# Patient Record
Sex: Female | Born: 1960 | Race: Black or African American | Hispanic: No | Marital: Single | Smoking: Never smoker
Health system: Southern US, Community
[De-identification: ages and names within clinical notes are randomized; demographics above are authoritative.]

## PROBLEM LIST (undated history)

## (undated) DIAGNOSIS — K219 Gastro-esophageal reflux disease without esophagitis: Secondary | ICD-10-CM

## (undated) HISTORY — PX: CHOLECYSTECTOMY: SHX55

## (undated) HISTORY — PX: ABDOMINAL HYSTERECTOMY: SHX81

## (undated) HISTORY — DX: Gastro-esophageal reflux disease without esophagitis: K21.9

---

## 2001-08-29 ENCOUNTER — Other Ambulatory Visit: Admission: RE | Admit: 2001-08-29 | Discharge: 2001-08-29 | Payer: Self-pay | Admitting: Obstetrics and Gynecology

## 2002-10-22 ENCOUNTER — Other Ambulatory Visit: Admission: RE | Admit: 2002-10-22 | Discharge: 2002-10-22 | Payer: Self-pay | Admitting: Obstetrics and Gynecology

## 2002-10-29 ENCOUNTER — Ambulatory Visit (HOSPITAL_COMMUNITY): Admission: RE | Admit: 2002-10-29 | Discharge: 2002-10-29 | Payer: Self-pay | Admitting: Obstetrics and Gynecology

## 2002-10-29 ENCOUNTER — Encounter: Payer: Self-pay | Admitting: Obstetrics and Gynecology

## 2003-11-02 ENCOUNTER — Other Ambulatory Visit: Admission: RE | Admit: 2003-11-02 | Discharge: 2003-11-02 | Payer: Self-pay | Admitting: Obstetrics and Gynecology

## 2003-11-02 ENCOUNTER — Ambulatory Visit (HOSPITAL_COMMUNITY): Admission: RE | Admit: 2003-11-02 | Discharge: 2003-11-02 | Payer: Self-pay | Admitting: Obstetrics and Gynecology

## 2004-03-30 ENCOUNTER — Inpatient Hospital Stay (HOSPITAL_COMMUNITY): Admission: EM | Admit: 2004-03-30 | Discharge: 2004-04-09 | Payer: Self-pay | Admitting: Emergency Medicine

## 2004-03-30 ENCOUNTER — Encounter: Admission: RE | Admit: 2004-03-30 | Discharge: 2004-03-30 | Payer: Self-pay | Admitting: Family Medicine

## 2004-04-01 ENCOUNTER — Encounter (INDEPENDENT_AMBULATORY_CARE_PROVIDER_SITE_OTHER): Payer: Self-pay | Admitting: *Deleted

## 2004-05-01 ENCOUNTER — Ambulatory Visit (HOSPITAL_COMMUNITY): Admission: RE | Admit: 2004-05-01 | Discharge: 2004-05-01 | Payer: Self-pay | Admitting: Surgery

## 2004-09-29 ENCOUNTER — Emergency Department (HOSPITAL_COMMUNITY): Admission: EM | Admit: 2004-09-29 | Discharge: 2004-09-29 | Payer: Self-pay | Admitting: Emergency Medicine

## 2004-11-02 ENCOUNTER — Other Ambulatory Visit: Admission: RE | Admit: 2004-11-02 | Discharge: 2004-11-02 | Payer: Self-pay | Admitting: Obstetrics and Gynecology

## 2004-11-17 ENCOUNTER — Ambulatory Visit (HOSPITAL_COMMUNITY): Admission: RE | Admit: 2004-11-17 | Discharge: 2004-11-17 | Payer: Self-pay | Admitting: Obstetrics and Gynecology

## 2005-11-04 IMAGING — RF DG CHOLANGIOGRAM ADD FILMS
7 series · 7 of 7 positions shown · non-contrast
Comparison: none

CLINICAL DATA: Status post cholecystectomy with partial common bile duct resection and re-anastomosis.  
 T-TUBE CHOLANGIOGRAM
 Spot film of the right upper quadrant was obtained as a scout view prior to contrast injection.  It shows the presence of a T-tube along with two surgical drains.  There is contrast in the colon from prior CT and endoscopy.
 Water-soluble contrast was injected gently into the T-tube.  Most of the contrast preferentially goes into the duodenum, where the distal limb of the T-tube is situated.  However, some contrast does extend into the intrahepatic biliary tree.  On early images, with the patient supine, there are a few tiny filling defects that may represent small stones, inspissated bowel, or air bubbles.  Later, the patient was placed semi-erect, and these filling defects are no longer seen.  I suspect that they were tiny air bubbles.  By placing the patient upright, a small amount of contrast does extend around the T-tube.  I do not see any definite extravasation, but only a very small amount of contrast could be gravitated around the tube at this time.  
 It might be prudent to repeat this study after further healing, prior to removal of the T-tube. 
 IMPRESSION
 1.  The study is limited because most of the contrast preferentially went into the duodenum through the lower limb of the T-tube.
 2.  No gross extravasation.
 3.  No definite retained stones.

[Series 1: run · 1 of 1 slices shown (1 of 7)]
[im 1/1]
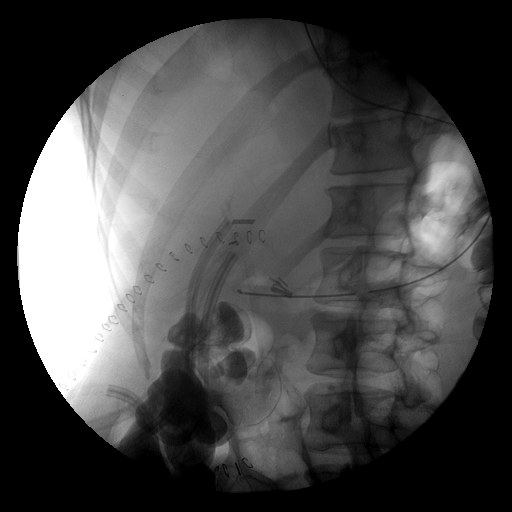

[Series 2: run · 1 of 1 slices shown (2 of 7)]
[im 1/1]
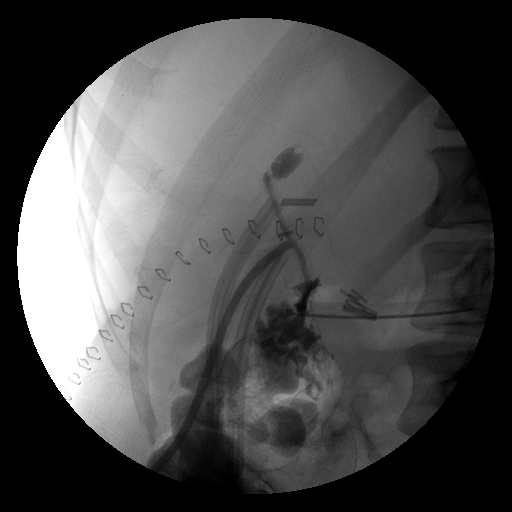

[Series 3: run · 1 of 1 slices shown (3 of 7)]
[im 1/1]
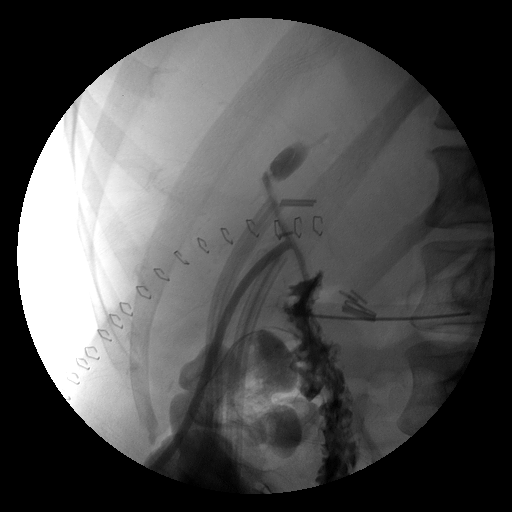

[Series 4: run · 1 of 1 slices shown (4 of 7)]
[im 1/1]
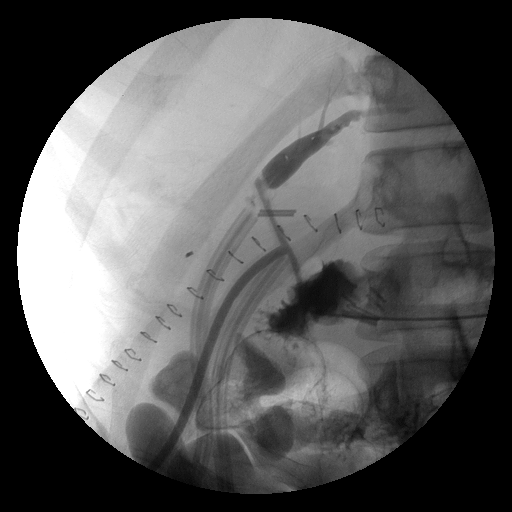

[Series 6: run · 1 of 1 slices shown (5 of 7)]
[im 1/1]
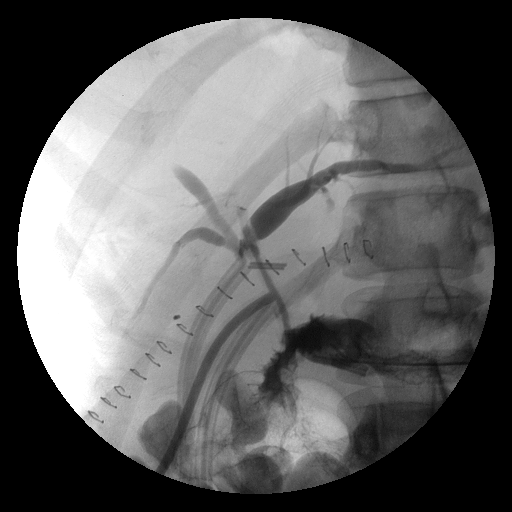

[Series 7: run · 1 of 1 slices shown (6 of 7)]
[im 1/1]
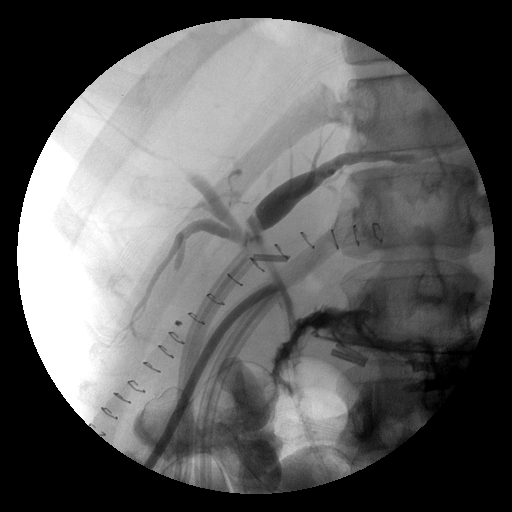

[Series 8: run · 1 of 1 slices shown (7 of 7)]
[im 1/1]
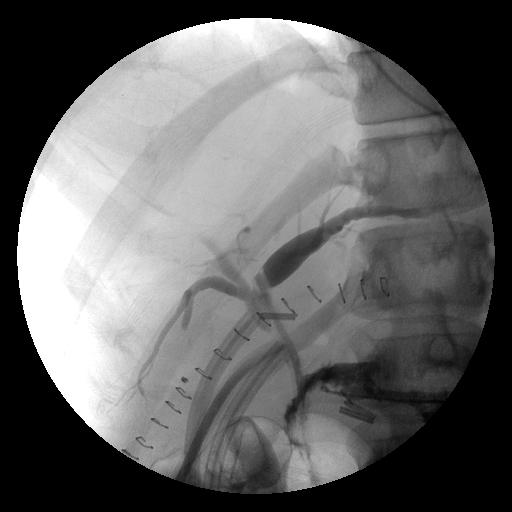

[7 of 7 positions shown; findings below may reference images not displayed]

## 2005-11-06 ENCOUNTER — Other Ambulatory Visit: Admission: RE | Admit: 2005-11-06 | Discharge: 2005-11-06 | Payer: Self-pay | Admitting: Obstetrics and Gynecology

## 2005-11-26 ENCOUNTER — Ambulatory Visit (HOSPITAL_COMMUNITY): Admission: RE | Admit: 2005-11-26 | Discharge: 2005-11-26 | Payer: Self-pay | Admitting: Obstetrics and Gynecology

## 2006-01-02 ENCOUNTER — Inpatient Hospital Stay (HOSPITAL_COMMUNITY): Admission: RE | Admit: 2006-01-02 | Discharge: 2006-01-04 | Payer: Self-pay | Admitting: Obstetrics and Gynecology

## 2006-01-02 ENCOUNTER — Encounter (INDEPENDENT_AMBULATORY_CARE_PROVIDER_SITE_OTHER): Payer: Self-pay | Admitting: Specialist

## 2009-01-04 ENCOUNTER — Ambulatory Visit: Payer: Self-pay | Admitting: Family Medicine

## 2009-02-19 ENCOUNTER — Emergency Department (HOSPITAL_COMMUNITY): Admission: EM | Admit: 2009-02-19 | Discharge: 2009-02-19 | Payer: Self-pay | Admitting: Emergency Medicine

## 2011-05-04 NOTE — H&P (Signed)
NAMEJAZLIN, [name]                         ACCOUNT NO.:  [id number]   MEDICAL RECORD NO.:  [id number]                   PATIENT TYPE:  EMS   LOCATION:  ED                                   FACILITY:  [facility]   PHYSICIAN:  Angelia Mould. Derrell Lolling, M.D.             DATE OF BIRTH:  Jan 07, 1961   DATE OF ADMISSION:  03/30/2004  DATE OF DISCHARGE:                                HISTORY & PHYSICAL   CHIEF COMPLAINT:  Epigastric pain, anorexia, dark urine.   HISTORY OF PRESENT ILLNESS:  This is a 50 year old black female who really  has not had any prior gastrointestinal problems.  Two weeks ago after eating  a large salad, she developed epigastric pain and nausea but she has not  vomited, not had diarrhea. She denies back pain or fever or chills. She has  been constipated. She states the epigastric pain has continued on a daily  basis but is variable in intensity. She endured it for a week.  Two days  ago, she stated the pain got a little bit worse and her urine became dark  and she noticed some itching. She called Dr. Susann Givens at that point and was  given an appointment for this Friday. Today the pain was worse and she came  in to see Dr. Jola Babinski  PA.  The patient was sent for a CT scan at [facility] which showed significantly dilated intrahepatic bile ducts, a few  gallstones, no obvious inflammation of the gallbladder, significant fibroid  uterus.  More worrisome is that she may have a mass at the head of the  pancreas.  This is not clear because of the technique of the skin.   Dr. Jola Babinski PA called me and asked me to see the patient and she was  brought to the [facility] Emergency Room. I am admitting her for further  evaluation and management.   PAST MEDICAL HISTORY:  Two pregnancies and two deliveries.  Occasional  bronchitis.  Last menstrual period was regular and on scheduled last month.  She denies any history of sickle cell disease, diabetes, hypertension, heart  disease,  stroke or sexually transmitted disease.   CURRENT MEDICATIONS:  She takes a diet pill call Trims-PA, she occasionally  takes BC powders.   ALLERGIES:  None known.   FAMILY HISTORY:  Mother living age 73 is prediabetic. Father is deceased but  she does not know anything of his medical history.  One brother healthy, one  sister healthy, one sister with sickle trait.   SOCIAL HISTORY:  The patient lives in [place], she is married, they have  two children. She works as a Diplomatic Services operational officer for [employer], a [employer] Denies  the use of tobacco.  Alcohol intake includes some beer and whiskey on the  weekends. She denies the use of any street drugs.   REVIEW OF SYMPTOMS:  All systems are reviewed. They are noncontributory  except  as described above.   PHYSICAL EXAMINATION:  GENERAL:  Thin, middle-aged, black female who appears  overall healthy but is uncomfortable and moving around in bed a lot. She  seems grumpy and out of sorts. She is cooperative and does give a consistent  history when questioned repeatedly.  VITAL SIGNS:  Temperature 98.9, pulse 82, respirations 72, blood pressure  131/90.  HEENT:  Eyes, sclera slightly icteric. Extraocular movements intact.  Ears,  nose, mouth, throat, nose, lips, tongue and oropharynx without gross  lesions.  NECK:  Supple, nontender, no thyromegaly, no adenopathy, no jugular venous  distention, no tenderness.  LUNGS:  Clear to auscultation, no chest wall tenderness, no CVA tenderness.  HEART:  Regular rate and rhythm, no murmur.  BREASTS:  Not examined.  ABDOMEN:  Soft and nondistended. She is subjectively tender in the right  upper quadrant but objectively there is no mass, no guarding, no rebound.  Bowel sounds are hypoactive, she is not distended. There is no palpable  mass.  GENITOURINARY:  No inguinal adenopathy or mass.  EXTREMITIES:  She moves all four extremities well without pain or deformity.  She has good peripheral pulses.  NEUROLOGIC:   No gross __________ deficits.   ADMISSION DATA:  CT scan suggests mass at the head of the pancreas although  technique is not good.  There are a few gallstones. She has significant  intrahepatic bile duct dilation.  She has a significant fibroid uterus.   Lab work reveals hemoglobin 12.7, white blood cell count 9800 but with a  left shift.  Complete metabolic panel reveals a total bilirubin of 4.8,  alkaline phosphatase of 696, SGPT of 488 otherwise her complete metabolic  panel is normal. Serum amylase is 118.  Urinalysis is pending.   ASSESSMENT:  1. Obstructive jaundice. It is not clear to me whether this is due to stone     disease or whether she may have a mass in the head of her pancreas. This     would be a little bit unusual at her age but will need to be further     evaluated.  2. Acute cholecystitis is not completely ruled out at this time but felt to     be less likely.  3. No evidence of pancreatitis chemically at this time but it is not     completely ruled out.   PLAN:  1. The patient will be admitted for further evaluation and management.  2. She will be started on IV Zosyn and kept n.p.o.  3. We are going to do a dedicated CT with thin cuts of the pancreas by     pancreatic protocol to see if there is a pancreatic mass.   She will repeat her lab work tomorrow morning to see what the trend is.   Dr. Carman Ching is going to see her to decide about indications and timing  of biliary endoscopy.   I told the patient that she may well need an operation at some point but not  until further evaluation.                                               Angelia Mould. Derrell Lolling, M.D.    HMI/MEDQ  D:  03/30/2004  T:  03/30/2004  Job:  [id number]   cc:   Sharlot Gowda, M.D.  [address]  [address]  [place], [place] [address]  Fax: [phone]   Llana Aliment. Malon Kindle., M.D.  [address], [address]  [place]  [place] [address]  Fax: [phone]

## 2011-05-04 NOTE — H&P (Signed)
[name], [name] NO.:  [id number]   MEDICAL RECORD NO.:  [id number]          PATIENT TYPE:  INP   LOCATION:  NA                            FACILITY:  [facility]   PHYSICIAN:  Janine Limbo, M.D.DATE OF BIRTH:  1961/11/14   DATE OF ADMISSION:  01/02/2006  DATE OF DISCHARGE:                                HISTORY & PHYSICAL   HISTORY OF PRESENT ILLNESS:  Ms. [name] is a 50 year old female para 2-0-0-2  who presents for a total abdominal hysterectomy.  The patient has been  followed at the [facility] and Gynecology division of  [facility].  The patient has known large fibroids and an  ultrasound showed a very large uterus with multiple fibroids (largest  fibroid measures 11 cm in size).  The ovaries appeared normal.  The patient  had an endometrial biopsy that was benign.  The patient's most recent Pap  smear is within normal limits.  The patient complains of increasing  abdominal pain and increasing dyspareunia.  The patient had a tubal ligation  in 1994.  Her periods are slightly irregular.  The patient has also had a  cholecystectomy in the past.   OBSTETRICAL HISTORY:  The patient had a normal spontaneous vaginal delivery  in 1992 and again in 1994.   ALLERGIES:  No known drug allergies.   SOCIAL HISTORY:  The patient denies cigarette use, alcohol use, and  recreational drug use.   REVIEW OF SYSTEMS:  The patient does have occasional constipation.   FAMILY HISTORY:  The patient has a sister with sickle cell trait.  She has a  maternal aunt with insulin-dependent diabetes.  Her maternal grandfather had  lung cancer.   PHYSICAL EXAMINATION:  VITAL SIGNS:  Weight 152 pounds, height 5 feet 8  inches.  HEENT:  Within normal limits.  CHEST:  Clear.  HEART:  Regular rate and rhythm.  BREASTS:  Without masses.  ABDOMEN:  Soft.  There is a large mass palpable to the umbilicus.  EXTREMITIES:  Grossly normal.  NEUROLOGIC:   Grossly normal.  PELVIC:  External genitalia is normal.  Vagina is normal.  Cervix is  nontender.  The uterus is 18-20 weeks size, irregular, and firm.  Adnexa:  No masses are appreciated.  Rectovaginal examination confirms.   ASSESSMENT:  1.  18-20-week sized fibroids.  2.  Dyspareunia.  3.  Irregular menstrual cycles.   PLAN:  The patient will undergo a total abdominal hysterectomy.  She  understands the indications for her procedure and she accepts the risks of,  but not limited to, anesthetic complications, bleeding, infections, and  possible damage to the surrounding organs.      Janine Limbo, M.D.  Electronically Signed     AVS/MEDQ  D:  01/01/2006  T:  01/01/2006  Job:  [id number]

## 2011-05-04 NOTE — Op Note (Signed)
[name], [name]                         ACCOUNT NO.:  [id number]   MEDICAL RECORD NO.:  [id number]                   PATIENT TYPE:  INP   LOCATION:  0466                                 FACILITY:  [facility]   PHYSICIAN:  Graylin Shiver, M.D.                DATE OF BIRTH:  12/29/1960   DATE OF PROCEDURE:  03/31/2004  DATE OF DISCHARGE:                                 OPERATIVE REPORT   PROCEDURE PERFORMED:  Endoscopic retrograde cholangiogram with  sphincterotomy.   INDICATIONS FOR PROCEDURE:  Abdominal pain, obstructive jaundice, abnormal  ultrasound and CT scan showing gallstones and probable common bile duct  stones.   Informed consent was obtained after explanation of the risks of bleeding,  infection, perforation, and pancreatitis.   PREMEDICATION:  Fentanyl 100 mcg IV, Versed 9 mg IV.   PROCEDURE:  With the patient lying on her abdomen on the fluoroscopy table  in x-ray, the lateral viewing duodenoscope was inserted into the oropharynx  and passed into the esophagus.  It was advanced down the esophagus, into the  stomach, and into the duodenum.  No specific abnormalities were noted in the  stomach or in the duodenum.  The papilla of Vater was localized.  It looked  normal, although I could see a lot of pus draining out of the papilla when  this was first seen.  The papillotome and guidewire were used to cannulate.  The pancreatic duct was initially cannulated a couple of times, then with  repositioning, I was able to selectively cannulate the common bile duct.  A  guidewire was advanced proximally, and contrast was injected into the common  bile duct and intrahepatic ducts.  The common bile duct and intrahepatic  ducts looked dilated.  There were no definite stones seen in the bile ducts.  There was a shadowing over the region of the upper common hepatic duct area,  but we felt this was due to bowel gas.  There were some filling defects  noted in the proximal ducts, but  we felt these were due to air.  Pus kept  draining out of the common bile duct.  The sphincterotome was properly  positioned, and a sphincterotomy was performed.  The duct was then swept  with a balloon.  I initially placed a 12 mm balloon but could not advance  this through the papilla.  I therefore switched to an 8.5 mm balloon and was  able to sweep the duct and was able to bring the 8.5 mm balloon out of the  papilla.  No obvious stones were seen, but pus was draining.  I did not see  the gallbladder or the cystic duct.  Dr. Colonel Bald from radiology was  present during a portion of the procedure.  There was quite a bit of air  present in the abdomen, and we could not get a clear picture as to whether  there  might be some extravasation or free air; therefore, immediately after  the ERCP, we took the patient to the CT scanner and CT'd her abdomen.  There  was no evidence of extravasation or free air.  We were able to see the  gallbladder on the CT scan, which did have some contrast in it and showed  stones.  She tolerated the procedure well.   IMPRESSION:  Dilated bile ducts with pus draining out of the biliary tree.  No obvious stones were seen.  A sphincterotomy was done, and the duct was  swept and decompressed.  There are stones present in the gallbladder.   PLAN:  1. Continue antibiotics.  2. Proceed with laparoscopic cholecystectomy.                                               Graylin Shiver, M.D.    Germain Osgood  D:  03/31/2004  T:  03/31/2004  Job:  [id number]   cc:   Angelia Mould. Derrell Lolling, M.D.  [address], [address]  [place]  [place] [address]  Fax: [phone]   Llana Aliment. Malon Kindle., M.D.  [address]  [place]  [place] [address]  Fax: [phone]

## 2011-05-04 NOTE — Discharge Summary (Signed)
[name], GREENWOOD NO.:  [id number]   MEDICAL RECORD NO.:  [id number]          PATIENT TYPE:  INP   LOCATION:  9317                          FACILITY:  [facility]   PHYSICIAN:  Janine Limbo, M.D.DATE OF BIRTH:  August 09, 1961   DATE OF ADMISSION:  01/02/2006  DATE OF DISCHARGE:                                 DISCHARGE SUMMARY   ADMISSION DIAGNOSES:  1.  Eighteen- to 20-week-size fibroid uterus.  2.  Dyspareunia.  3.  Irregular menstrual cycles.   POSTOPERATIVE DIAGNOSES:  1.  Eighteen- to 20-week-size fibroid uterus.  2.  Dyspareunia.  3.  Irregular menstrual cycles.  4.  Anemia.   PROCEDURES THIS ADMISSION:  January 02, 2006 - total abdominal hysterectomy.   HISTORY OF PRESENT ILLNESS:  [name] is a 50 year old female para 2-0-0-2  who presents with the above-mentioned diagnoses. She has not responded to  conservative measures. Please see her dictated history and physical exam for  details.   ADMISSION PHYSICAL EXAMINATION:  The patient was noted to have an 18- to 20-  week-size fibroid uterus.   HOSPITAL COURSE:  On the day of admission the patient was taken to the  operating room where she had a total abdominal hysterectomy performed.  Operative findings included an 18- to 20-week-size fibroid uterus that  weighed approximately 1700 g. The ovaries appeared normal bilaterally. 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 The patient tolerated  her procedure well. Her postoperative course was uneventful. Her  postoperative hemoglobin was 10.1 (preoperative hemoglobin was 12.5). The  patient remained afebrile throughout her hospital stay. The patient  tolerated a regular diet. She was ready for discharge on postoperative day  #2.   DISCHARGE MEDICATIONS:  1.  Vicodin one or two tablets every 4 hours as needed for pain.  2.  Ibuprofen 600 mg every 6 hours as needed for pain.  3.  Iron 325 mg twice each day for 6 weeks.   FOLLOW-UP  INSTRUCTIONS:  The patient will return to see Dr. Stefano Gaul in 6  weeks for follow-up examination. She will refrain from driving for 2 weeks,  heavy lifting for 4 weeks, and intercourse for 6 weeks. She was given a  copy of the postoperative instruction sheet as prepared by [facility] and Gynecology (a division of [facility]). She  will call with questions or concerns.   Final pathology report pending at the time of discharge.      Janine Limbo, M.D.  Electronically Signed     AVS/MEDQ  D:  01/04/2006  T:  01/04/2006  Job:  [id number]

## 2011-05-04 NOTE — Op Note (Signed)
[name], [name] NO.:  [id number]   MEDICAL RECORD NO.:  [id number]          PATIENT TYPE:  INP   LOCATION:  9317                          FACILITY:  [facility]   PHYSICIAN:  Janine Limbo, M.D.DATE OF BIRTH:  19-Apr-1961   DATE OF PROCEDURE:  01/02/2006  DATE OF DISCHARGE:                                 OPERATIVE REPORT   PREOPERATIVE DIAGNOSIS:  1.  Fibroid uterus.  2.  Dyspareunia.  3.  Irregular menstrual cycles.   POSTOPERATIVE DIAGNOSIS:  1.  Fibroid uterus.  2.  Dyspareunia.  3.  Irregular menstrual cycles.  4.  Right hydatid cyst.   PROCEDURE:  1.  Total abdominal hysterectomy.  2.  Removal of right hydatid cyst.   SURGEON:  Dr. Leonard Schwartz   FIRST ASSISTANT:  Naima A. Normand Sloop, M.D.   ANESTHETIC:  General.   DISPOSITION:  Ms. Keiper is a 50 year old female, para 2-0-0-2, who presents  with the above-mentioned diagnoses. She understands the indications for her  procedure and she accepts the risks of, but not limited to, anesthetic  complications, bleeding, infections, and possible damage to surrounding  organs.   FINDINGS:  The patient had an 18-week size multi fibroid uterus. There was a  large pedunculated fibroid on the fundus of the uterus that measured  approximately 16 cm x 12 cm. The fallopian tubes and ovaries appeared normal  except for defects in the tubes from the patient's prior tubal ligation.  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 There was no  evidence of pathology in the pelvis. The uterus weighed approximately 1700  grams.   PROCEDURE:  The patient was taken to the operating room where a general  anesthetic was given. The patient's abdomen, perineum, and vagina were  prepped with multiple layers of Betadine. Examination under anesthesia was  performed. A Foley catheter was placed in the bladder. The patient was  sterilely draped. The lower abdomen was injected with 20 mL of half percent  Marcaine with epinephrine. A low transverse incision was made and carried  sharply through the subcutaneous tissue, fascia, and the anterior  peritoneum. The bowel was packed cephalad. The ureters were identified  bilaterally and were thought to be away from our surgical areas. The round  ligaments were identified bilaterally. The left round ligament was clamped,  cut, sutured, and held to the side. The left utero-ovarian ligament and left  fallopian tube were then clamped, cut, free tied, and suture ligated. The  bladder flap was developed anteriorly. The uterine vessels were  skeletonized. Identical procedure was then carried out on the opposite side.  Then alternating from left-to-right the uterine arteries, parametrial  tissues, paracervical tissues, uterosacral ligaments, and vaginal angles  were then clamped, cut, sutured, and tied securely. The uterus was then  transected from the apex of the vagina. The cervix was noted to be  completely removed. The apex of the vagina was then closed using figure-of-  eight sutures. The pelvis was irrigated. An area of bleeding was made  hemostatic using a free tie of 0 Vicryl.  The ureters were again identified  and were thought to be without harm. The pelvis was irrigated for one final  time and again hemostasis was noted to be adequate. The patient was noted to  drain clear yellow urine. The hydatid cyst on the right tube was then  clamped and cut. A free tie was used to secure hemostasis. All instruments  were then removed. The anterior peritoneum was closed using running suture  of 3-0 Vicryl. The subcutaneous tissue, fascia, and abdominal musculature  were irrigated. Hemostasis was adequate. The fascia was then closed using a  running suture of 0 Vicryl followed by three interrupted sutures of 0  Vicryl. The subcutaneous layer was closed using a running suture of 0  Vicryl. The skin was reapproximated using a subcuticular suture of 3-0   Monocryl. Sponge, needle, instrument counts were correct on two occasions.  The estimated blood loss was 300 mL.  The patient tolerated her procedure  well. She was awakened from her anesthetic without difficulty and taken to  the recovery room in stable condition.      Janine Limbo, M.D.  Electronically Signed     AVS/MEDQ  D:  01/02/2006  T:  01/02/2006  Job:  [id number]

## 2011-05-04 NOTE — Consult Note (Signed)
[name], [name]                         ACCOUNT NO.:  [id number]   MEDICAL RECORD NO.:  [id number]                   PATIENT TYPE:  INP   LOCATION:  0102                                 FACILITY:  [facility]   PHYSICIAN:  James L. Malon Kindle., M.D.          DATE OF BIRTH:  1961/10/18   DATE OF CONSULTATION:  03/30/2004  DATE OF DISCHARGE:                                   CONSULTATION   REFERRING PHYSICIAN:  Dr. Claud Kelp.   HISTORY:  A 50 year old African-American female, who states that she was  well up until approximately 2 weeks ago when she ate a bad salad.  She has  had epigastric pain for 2 weeks.  She states that up until this time, she  had been pain free.  She had no heartburn, no indigestion, good bowel  habits.  She had not lost weight and in fact, she had just started back on  the pill called Trims PA which was some sort of over-the-counter diet pill.  She also has taken [facility] Powders for pain and cramps during her period.  Her  last period was March 5 and right around that time, she took [facility] Powders on a  regular basis.  She has never had ulcers.  She has continued to have pain  and developed dark urine was to be seen in Dr. Jola Babinski office yesterday  and came into his office today demanding to be seen and had a CT scan done  at [facility].  I do not have that scan available to me, but the report  is that it showed a thickened pancreas and dilated biliary ducts.  Apparently, this has been reviewed, and there is a real concern about a mass  in the head of the pancreas, and a scan has been ordered specific to the  pancreas.  The skin also showed cholelithiasis.  She was sent to the  emergency room to be admitted by Dr. Derrell Lolling.  Called on to questions about  her biliary system.   Pertinent labs reveal an alk phos of 696, total bilirubin of 4.8, GPT 488,  GOT 419, albumin 3.8.  White count was normal at 9.8, hemoglobin 12.7.  The  patient denies any previous history  of ulcers and denies feeling bad prior  to eating the bad salad 2 weeks ago.   CURRENT MEDICATIONS:  1. BC Powders p.r.n.  2. Trims PA.   ALLERGIES:  She has no drug allergies.   MEDICAL HISTORY:  No chronic medical problems.   FAMILY HISTORY:  Negative for gallstones.   REVIEW OF SYSTEMS:  Primarily as noted above.   PHYSICAL EXAMINATION:  VITAL SIGNS:  Temperature 98.9, pulse 82, blood  pressure 131/90.  GENERAL:  A somewhat thin, anxious, African-American female, who is lying on  the bed and stating that she is cold and her abdomen is hurting.  EYE:  Sclerae are icteric.  MOUTH:  Mucous  membranes are moist.  NECK:  Supple.  LUNGS:  Clear.  HEART:  Regular rate and rhythm.  No murmurs or gallops.  ABDOMEN:  Very mild tenderness in the right upper quadrant.  No guarding,  rigidity, or rebound.   ASSESSMENT:  Jaundice, cholelithiasis, and dilated biliary system on CT with  a mass in the head of the pancreas could be due to many things.  Her amylase  is only 118.  She is a nondrinker.  I suspect this is all gallstones.  She  is a bit young for a pancreatic malignancy, but this does need to be  evaluated further.   PLAN:  We will go ahead with a repeat CT to look specifically at the biliary  system and pancreas and agree with empiric antibiotics and pain control.  If  there is still a question, then I think she will need an ERCP with a  possible stent or sphincterotomy should a stone be present.  I have  discussed this with she and her mother.                                               James L. Malon Kindle., M.D.    Waldron Session  D:  03/30/2004  T:  03/30/2004  Job:  [id number]   cc:   Angelia Mould. Derrell Lolling, M.D.  [address], [address]  [place]  [place] [address]  Fax: [phone]   Sharlot Gowda, M.D.  [address]  [place], [place] [address]  Fax: [phone]

## 2011-05-04 NOTE — Op Note (Signed)
NAMEEVADNE, OSE                         ACCOUNT NO.:  [id number]   MEDICAL RECORD NO.:  [id number]                   PATIENT TYPE:  INP   LOCATION:  0466                                 FACILITY:  [facility]   PHYSICIAN:  Currie Paris, M.D.           DATE OF BIRTH:  19-Jul-1961   DATE OF PROCEDURE:  04/01/2004  DATE OF DISCHARGE:                                 OPERATIVE REPORT   PREOPERATIVE DIAGNOSES:  1. Chronic calculus cholecystitis.  2. Possible choledocholithiasis.   POSTOPERATIVE DIAGNOSES:  Severe chronic calculus cholecystitis with  choledocholithiasis.   OPERATION:  Laparoscopy, open cholecystectomy with common duct exploration  and choledochoduodenostomy.   SURGEON:  Dr. Jamey Ripa.   ASSISTANT:  Dr. Magnus Ivan.   ANESTHESIA:  General endotracheal.   CLINICAL HISTORY:  This patient is a 50 year old, who presented with some  obstructive jaundice.  An ERCP has shown pus draining out of the  gallbladder, but __________ stones could not be seen, and the anatomy was  really not well-defined on the films.  Because of her ongoing right upper  quadrant pain, known stones in the gallbladder, she was scheduled for  cholecystectomy with plans for laparoscopic cholecystectomy if possible.   DESCRIPTION OF PROCEDURE:  The patient was in the holding area and had no  further questions.  She was taken to the operating room and after  satisfactory general endotracheal anesthesia had been obtained, the abdomen  was prepped and draped.  Marcaine 0.25% was used for the initial incisions  and the umbilical incision made, the fascia opened, and the peritoneal  cavity entered under direct vision.  A pursestring was placed, the Hasson  introduced, and the abdomen insufflated to 15.  The patient was placed in  reverse Trendelenburg and trocars placed in the epigastrium and right upper  quadrant in the usual fashion.  The gallbladder was thick-walled and very  difficult to grasp and  looked like it was contracted down around some  stones.  The duodenum was pulled way up on the gallbladder and stuck and  very adherent.  We began our dissection by trying to grasp what appeared to  be known gallbladder and using a combination of dissection and  hydrodissection, I tried to pull the duodenum off of the gallbladder.  There  were some fibrofatty tissue that we noticed and hard stones and what  appeared to be the more proximal fundus of the gallbladder but again, this  anatomy was not clear.  We spent about 20 minutes simply trying to dissect  and find a dissection plane.  I opened up some of the peritoneum on what was  obviously the gallbladder to see if I could find a plane there, and none was  found.  Further dissection was done inferiorly, and we saw a tubular  structure which from the tugging, had a little bit of a bile leak noted.  I  continued to dissect it out for  a few minutes and decided this was most  likely the common duct but elected to put a small catheter in and do a  cholangiogram to see if that would help to define this anatomy initially  before we proceeded any further.   The [facility] catheter was introduced and placed in this and held with a small  clip.  Operative cholangiography did show good filling of the duodenum, and  this was clearly the common duct.   At this point, the laparoscopic instruments were removed and the abdomen  opened through a right subcostal incision.  A skin incision made with a  knife and the subcutaneous tissue, fascia, and muscles divided with the  cautery.  Self-retaining retractors were placed.   The prior irrigant that we had been using for irrigation and dissection was  suctioned out.  The gallbladder was thick-walled, chronically inflamed, and  stuck down to the area of the common duct and portal triad.   I had to grasp the dome of the gallbladder with Kocher and amputate the  gallbladder from above to below using cautery.   There was skin and no plane  of dissection between the gallbladder and the liver, but I was able to get  down to the gallbladder both anteriorly and posteriorly until I got into the  area to where I could not really identify anatomy.  Palpation of this area  showed there were stones in the gallbladder and stones stuck up towards the  liver.  Since I already an opening in the common duct just distal to the  gallbladder, I put a right-angle clamp up in this and could feel stones up  in this area.  There appeared to be no plane of dissection between the  gallbladder and the common duct so at this point, I opened the common duct  longitudinally, starting where it was already open just distal to what I  thought was the cystic duct going proximally into the common hepatic duct.  There were multiple stones in the common duct going all the way up in the  common hepatic duct, and these were gently pulled out.  I then opened the  gallbladder from its dome into the common duct so that I could clearly have  this area all opened up and identified.   It was clear at this point that the gallbladder had basically eroded itself  into the common duct, and there was a common channel of gallbladder common  duct that was approximately 3 cm long with stones in the gallbladder, stones  in the common hepatic duct.   Using the scissors, I cut the gallbladder off of the common duct.  I then  irrigated and tried to assess the situation.  We had a very longitudinal  choledochotomy.  However, all this tissue was chronically inflamed and  ragged, and I thought we were going to need to do a choledochoenteric  anastomosis.   At this point, to that end, I dissected off the common duct distal to where  the gallbladder had entered and once I had that clearly surrounded, I went  ahead and put 3 clips on it and completely divided the common duct in two. With this as a handle, I was then able to dissect proximally on the  common  duct going to where I was above where the gallbladder had entered and where  the common channel and all the inflamed tissue was and had the common duct  dissected out circumferentially at  that point.   I put a couple of stay sutures in the common duct here, and it was about 15  mm across at a minimum.  I divided the common duct here so that we had it  opened.  We explored the proximal hepatic ducts, and there were no  additional stones present.   We considered doing both a choledochoduodenostomy and a  choledochojejunostomy.  The duodenum was quite mobile and with a little bit  of freeing up, was basically right up on the common duct, and we had a  fairly long section of common hepatic duct to work with.  I therefore  elected to do a choledochoduodenostomy.   I used 4-0 Prolenes and put a back row of seromuscular sutures into the  duodenum and then into the back wall of the common duct.  Once this row was  placed, they were all tied and the middle ones cut, leaving the two end ones  open.  The duodenum was opened with the cautery.   An inner row of 4-0 PDS was then placed through-and-through duodenum, common  duct.  Once I had the back row placed, I took an 18 T2.  I made a small  opening into the common duct well above where the anastomosis was and  brought the T2 about with one limb going into the hepatic ducts and the  other limb going down into the duodenum.  Once that was placed, I went ahead  and completed the inner row of 4-0 PDS running to complete the front row of  the anastomosis with the inner layer and then put an outer layer of  seromuscular 4-0 Prolenes which all tied easily and with no tension.  The  anastomosis appeared completely intact, and everything looked viable.   At this point, I irrigated the T-tube and saw a little leakage of fluid.  Initially, it appeared to be leaking around the anastomosis but then further  inspection showed that actually as we  irrigated it, there was a leak out of  the duct of Luschka about a third of the way up the posterior wall of the  gallbladder fossa.  This was suture ligated with a 2-0 chromic and again,  irrigation then appeared to show no evidence of leak.   We spent some time irrigating the abdomen.  I brought the T-tube out through  a lateral stab wound, secured it with some 2-0 nylons.  I put two Blake 19  drains in, one anterior and one posterior to the common duct area to catch  any leakage that might occur.  The liver bed appeared to be completely dry.   The abdomen was then closed with a running 0 PDS in the posterior sheath,  running #1 PDS on the anterior sheath, staples on the skin.   The patient tolerated the procedure well.  There were no operative  complications.  All counts were correct.                                               Currie Paris, M.D.   CJS/MEDQ  D:  04/01/2004  T:  04/01/2004  Job:  [id number]   cc:   Sharlot Gowda, M.D.  [address]  [place], [place] [address]  Fax: [phone]   Graylin Shiver, M.D.  [address]  [address]  [place], [place] [address]  Fax: [phone]

## 2011-05-04 NOTE — Discharge Summary (Signed)
NAMESEVERA, [name]                         ACCOUNT NO.:  [id number]   MEDICAL RECORD NO.:  [id number]                   PATIENT TYPE:  INP   LOCATION:  0466                                 FACILITY:  [facility]   PHYSICIAN:  Currie Paris, M.D.           DATE OF BIRTH:  [birth year]   DATE OF ADMISSION:  03/30/2004  DATE OF DISCHARGE:  04/09/2004                                 DISCHARGE SUMMARY   FINAL DIAGNOSES:  1. Chronic calculous cholecystitis.  2. Choledocholithiasis.  3. Cholecystocholedochal fistula.  4. Malnutrition.   CLINICAL HISTORY:  [name] is a 50 year old lady who was apparently well  until two weeks ago when she developed chronic epigastric pain with some  weight loss and had a CT done which showed a question of a mass in the head  of the pancreas, also noted to be jaundiced and also noted to have  cholelithiasis.  On admission exam, she had mild tenderness to the right  upper quadrant.  Her amylase was 118.   HOSPITAL COURSE:  The patient was admitted, evaluation begun and GI  consultation obtained. She had an ERCP and they saw no specific problem in  the area of the head of the pancreas to suggest tumor. She did appear to  have gallstones and I thought they had cleared her common duct.  A CT scan  was somewhat confusing initially as there was a question of stones in the  gallbladder and stones in the common duct although the ERCP was not clear on  this issue. At any rate, she was scheduled for cholecystectomy with plans  for an attempt at a laparoscopic cholecystectomy.   The patient was taken to the operating room on April 16 and at the time of  surgery was found to have significant choledocholithiasis and basically the  stones had eroded from her gallbladder into her common duct leaving a fairly  long junction between the gallbladder and common duct. She had to be  converted to open with cholecystectomy and choledochoduodenostomy with CT   drainage.   The patient actually tolerated the procedure well. She drained bowel out of  her T tube and her liver functions began to gradually improve. Her amylase  remained normal. We left her on NG drainage for several days because of the  duodenocholedochal anastomosis.  She never really drained any bowel out of  her JP drains only the T tube.  By about the fourth postoperative day, she  was improving a little bit. She did note some blood in her urine but was  having her menstrual.  There was no evidence of urinary tract infection. We  did a T tube cholangiogram which showed no evidence of leak and good  drainage and good decompression of the hepatic ducts. We began her on  liquids and increased her diet. We discontinued her antibiotics.  Over the  next couple of days, she was able to  tolerate her T tube being clamped. She  tolerated conversion to oral nicely.   The patient was ready for discharge and was sent home on the 24th with her T  tube to drainage and one JP left in and one pulled out prior to discharge.   LABORATORY DATA:  Laboratory studies this admission initially showed a white  count of 9800 with a hemoglobin of 12.7.  This returned to normal  postoperatively although her hemoglobin drifted down to 9.3.  Electrolytes showed a couple of low sodiums but this was very minimal and  not symptomatic. At one point, she developed some hypokalemia repaired with  changing of her T&A's.  Liver functions initially were 4.8 going up to 6.1  gradually down to 2.4 a few days prior to discharge.  Cultures from her  urine were no growth.  EKG was normal.                                               Currie Paris, M.D.    CJS/MEDQ  D:  04/26/2004  T:  04/26/2004  Job:  [id number]   cc:   Fayrene Fearing L. Malon Kindle., M.D.  [address]  [place]  [place] [address]  Fax: [phone]   Sharlot Gowda, M.D.  [address]  [place], [place] [address]  Fax: [phone]

## 2013-01-24 ENCOUNTER — Ambulatory Visit: Payer: Managed Care, Other (non HMO) | Admitting: Family Medicine

## 2013-01-24 VITALS — BP 111/70 | HR 70 | Temp 98.4°F | Resp 16 | Ht 69.0 in | Wt 203.0 lb

## 2013-01-24 DIAGNOSIS — A599 Trichomoniasis, unspecified: Secondary | ICD-10-CM

## 2013-01-24 DIAGNOSIS — R300 Dysuria: Secondary | ICD-10-CM

## 2013-01-24 DIAGNOSIS — N898 Other specified noninflammatory disorders of vagina: Secondary | ICD-10-CM

## 2013-01-24 LAB — POCT URINALYSIS DIPSTICK
Bilirubin, UA: NEGATIVE
Glucose, UA: NEGATIVE
Ketones, UA: NEGATIVE
Nitrite, UA: NEGATIVE
Protein, UA: 30
Spec Grav, UA: >=1.03
Urobilinogen, UA: 0.2
pH, UA: 5.5

## 2013-01-24 LAB — POCT UA - MICROSCOPIC ONLY
Bacteria, U Microscopic: NEGATIVE
Casts, Ur, LPF, POC: NEGATIVE
Crystals, Ur, HPF, POC: NEGATIVE
Mucus, UA: NEGATIVE
Yeast, UA: NEGATIVE

## 2013-01-24 LAB — POCT WET PREP WITH KOH: Trichomonas, UA: POSITIVE

## 2013-01-24 MED ORDER — PHENAZOPYRIDINE HCL 200 MG PO TABS: 200.0000 mg | ORAL_TABLET | Freq: Three times a day (TID) | ORAL | Status: AC | PRN

## 2013-01-24 MED ORDER — METRONIDAZOLE 500 MG PO TABS: 2000.0000 mg | ORAL_TABLET | Freq: Three times a day (TID) | ORAL | Status: DC

## 2013-01-24 NOTE — Patient Instructions (Addendum)
Trichomoniasis Trichomoniasis is an infection, caused by the Trichomonas organism, that affects both women and men. In women, the outer female genitalia and the vagina are affected. In men, the penis is mainly affected, but the prostate and other reproductive organs can also be involved. Trichomoniasis is a sexually transmitted disease (STD) and is most often passed to another person through sexual contact. The majority of people who get trichomoniasis do so from a sexual encounter and are also at risk for other STDs. CAUSES   Sexual intercourse with an infected partner.  It can be present in swimming pools or hot tubs. SYMPTOMS   Abnormal gray-green frothy vaginal discharge in women.  Vaginal itching and irritation in women.  Itching and irritation of the area outside the vagina in women.  Penile discharge with or without pain in males.  Inflammation of the urethra (urethritis), causing painful urination.  Bleeding after sexual intercourse. RELATED COMPLICATIONS  Pelvic inflammatory disease.  Infection of the uterus (endometritis).  Infertility.  Tubal (ectopic) pregnancy.  It can be associated with other STDs, including gonorrhea and chlamydia, hepatitis B, and HIV. COMPLICATIONS DURING PREGNANCY  Early (premature) delivery.  Premature rupture of the membranes (PROM).  Low birth weight. DIAGNOSIS   Visualization of Trichomonas under the microscope from the vagina discharge.  Ph of the vagina greater than 4.5, tested with a test tape.  Trich Rapid Test.  Culture of the organism, but this is not usually needed.  It may be found on a Pap test.  Having a "strawberry cervix,"which means the cervix looks very red like a strawberry. TREATMENT   You may be given medication to fight the infection. Inform your caregiver if you could be or are pregnant. Some medications used to treat the infection should not be taken during pregnancy.  Over-the-counter medications or  creams to decrease itching or irritation may be recommended.  Your sexual partner will need to be treated if infected. HOME CARE INSTRUCTIONS   Take all medication prescribed by your caregiver.  Take over-the-counter medication for itching or irritation as directed by your caregiver.  Do not have sexual intercourse while you have the infection.  Do not douche or wear tampons.  Discuss your infection with your partner, as your partner may have acquired the infection from you. Or, your partner may have been the person who transmitted the infection to you.  Have your sex partner examined and treated if necessary.  Practice safe, informed, and protected sex.  See your caregiver for other STD testing. SEEK MEDICAL CARE IF:   You still have symptoms after you finish the medication.  You have an oral temperature above 102 F (38.9 C).  You develop belly (abdominal) pain.  You have pain when you urinate.  You have bleeding after sexual intercourse.  You develop a rash.  The medication makes you sick or makes you throw up (vomit). Document Released: 05/29/2001 Document Revised: 02/25/2012 Document Reviewed: 06/24/2009 [employer] Patient Information 2013 [employer].  

## 2013-01-24 NOTE — Progress Notes (Signed)
Subjective:    Patient ID: [name], female    DOB: [birth year], 52 y.o.   MRN: [id number] Chief Complaint  Patient presents with  . Urinary Tract Infection    Dysuria x 3 weeks  . Vaginal Itching    x several months  . Rash    HPI   Has been going on for about 3 wk - intermittently, tried drinking a lot of cranberry juice and pushing fluids, having urinary hesitancy and dysuria.  No flank pain, no abd/pelvic, no n/v.  No f/c. Is having some vaginal discharge and itching - yellow-green. Would like to be tested for stds. Not really sexually active - has had sex twice in the past yr - both were 1 night stands when pt was out of town and she does not know how to get a hold of these men. Has had a hysterectomy (benign reasons) but cervix was left in place per pt and still has both ovaries. Past Medical History  Diagnosis Date  . GERD (gastroesophageal reflux disease)    No current outpatient prescriptions on file prior to visit.   No current facility-administered medications on file prior to visit.   No Known Allergies    Review of Systems  Constitutional: Negative for fever, chills, diaphoresis, activity change, appetite change, fatigue and unexpected weight change.  Gastrointestinal: Negative for nausea, vomiting, abdominal pain, diarrhea, constipation, blood in stool, anal bleeding and rectal pain.  Genitourinary: Positive for dysuria, vaginal discharge, difficulty urinating and vaginal pain. Negative for urgency, frequency, hematuria, flank pain, decreased urine volume, vaginal bleeding, enuresis, genital sores, menstrual problem and pelvic pain.  Musculoskeletal: Negative for gait problem.  Skin: Negative for rash.  Hematological: Negative for adenopathy.  Psychiatric/Behavioral: The patient is not nervous/anxious.       BP 111/70  Pulse 70  Temp(Src) 98.4 F (36.9 C)  Resp 16  Ht 5\' 9"  (1.753 m)  Wt 203 lb (92.08 kg)  BMI 29.96 kg/m2  SpO2 98% Objective:   Physical Exam  Constitutional: She is oriented to person, place, and time. She appears well-developed and well-nourished. No distress.  HENT:  Head: Normocephalic and atraumatic.  Cardiovascular: Normal rate, regular rhythm, normal heart sounds and intact distal pulses.   Pulmonary/Chest: Effort normal and breath sounds normal.  Abdominal: Soft. Bowel sounds are normal. She exhibits no distension. There is no hepatosplenomegaly. There is no tenderness. There is no rebound, no guarding and no CVA tenderness.  Large scar across RUQ from cholecystectomy converted to open  Genitourinary: Uterus normal. Pelvic exam was performed with patient supine. There is no rash, tenderness or lesion on the right labia. There is no rash, tenderness or lesion on the left labia. Cervix exhibits no motion tenderness and no friability. Right adnexum displays no mass, no tenderness and no fullness. Left adnexum displays no mass, no tenderness and no fullness. No erythema or tenderness around the vagina. Vaginal discharge found.  No cervix seen, very tender on bimanual. Copious amount of thin yellow-green odiverous discharge. No uterus - surgically absent  Lymphadenopathy:       Right: No inguinal adenopathy present.       Left: No inguinal adenopathy present.  Neurological: She is alert and oriented to person, place, and time.  Skin: Skin is warm and dry. She is not diaphoretic.  Psychiatric: She has a normal mood and affect. Her behavior is normal.       Results for orders placed in visit on 01/24/13  POCT URINALYSIS DIPSTICK  Result Value Range   Color, UA yellow     Clarity, UA cloudy     Glucose, UA neg     Bilirubin, UA neg     Ketones, UA neg     Spec Grav, UA >=1.030     Blood, UA moderate     pH, UA 5.5     Protein, UA 30     Urobilinogen, UA 0.2     Nitrite, UA neg     Leukocytes, UA large (3+)    POCT UA - MICROSCOPIC ONLY      Result Value Range   WBC, Ur, HPF, POC tntc     RBC, urine,  microscopic 2-4     Bacteria, U Microscopic neg     Mucus, UA neg     Epithelial cells, urine per micros 5-10     Crystals, Ur, HPF, POC neg     Casts, Ur, LPF, POC neg     Yeast, UA neg    POCT WET PREP WITH KOH      Result Value Range   Trichomonas, UA Positive     Clue Cells Wet Prep HPF POC 1-3     Epithelial Wet Prep HPF POC 2-3     Yeast Wet Prep HPF POC negative     Bacteria Wet Prep HPF POC 2+     RBC Wet Prep HPF POC 2-3     WBC Wet Prep HPF POC 30-35     KOH Prep POC Negative      Assessment & Plan:  1. UTI - will not treat - no bacteria in urine so ua sxs likey caused by trich. Clx pendning so if clx is +, pt will need informed and antibiotic sent in. 2. Trich - 2000mg  flagyl po x 1 now - pt is not currently sexually active and her last partner was a 1 night stand out of town - does not know how to get into contact with him but at least she won't re-infect herself  Meds ordered this encounter  Medications  . metroNIDAZOLE (FLAGYL) 500 MG tablet    Sig: Take 4 tablets (2,000 mg total) by mouth 3 (three) times daily.    Dispense:  4 tablet    Refill:  0  . phenazopyridine (PYRIDIUM) 200 MG tablet    Sig: Take 1 tablet (200 mg total) by mouth 3 (three) times daily as needed for pain.    Dispense:  10 tablet    Refill:  0

## 2013-01-25 LAB — URINE CULTURE: Colony Count: 70000

## 2013-01-27 LAB — GC/CHLAMYDIA PROBE AMP
CT Probe RNA: NEGATIVE
GC Probe RNA: NEGATIVE

## 2013-10-19 ENCOUNTER — Encounter (HOSPITAL_BASED_OUTPATIENT_CLINIC_OR_DEPARTMENT_OTHER): Payer: Self-pay | Admitting: Emergency Medicine

## 2013-10-19 ENCOUNTER — Emergency Department (HOSPITAL_BASED_OUTPATIENT_CLINIC_OR_DEPARTMENT_OTHER)
Admission: EM | Admit: 2013-10-19 | Discharge: 2013-10-19 | Discharge status: Against Medical Advice | Payer: Self-pay | Attending: Emergency Medicine | Admitting: Emergency Medicine

## 2013-10-19 DIAGNOSIS — R109 Unspecified abdominal pain: Secondary | ICD-10-CM | POA: Insufficient documentation

## 2013-10-19 NOTE — ED Notes (Signed)
Went to obtain vitals on this pt.  Pt states "where am I?  I told my work that this would pass in a few minutes I don't need to be here. I had my gallbladder out a few years ago and this happens when I eat greasy food. I made the mistake and ate at [employer] this morning and its just not settling." Address given to patient.  Sissy Hoff notified.

## 2013-10-19 NOTE — ED Notes (Signed)
Abdominal pain at work rates 10/10 states she has this about once a month since she had her gallbladder removed year ago

## 2019-09-24 ENCOUNTER — Other Ambulatory Visit: Payer: Self-pay

## 2019-09-24 ENCOUNTER — Emergency Department (HOSPITAL_BASED_OUTPATIENT_CLINIC_OR_DEPARTMENT_OTHER)
Admission: EM | Admit: 2019-09-24 | Discharge: 2019-09-24 | Discharge status: Against Medical Advice | Payer: Managed Care, Other (non HMO)

## 2019-09-24 NOTE — ED Notes (Signed)
Pt advised EMT to wait when called from ED WR-reg clerk then advised that pt is not being seen because she was no longer having pain

## 2020-03-08 ENCOUNTER — Encounter (HOSPITAL_COMMUNITY): Payer: Self-pay | Admitting: Emergency Medicine

## 2020-03-08 ENCOUNTER — Other Ambulatory Visit: Payer: Self-pay

## 2020-03-08 ENCOUNTER — Emergency Department (HOSPITAL_COMMUNITY)
Admission: EM | Admit: 2020-03-08 | Discharge: 2020-03-09 | Disposition: A | Discharge status: Home / Self Care | Payer: 59 | Attending: Emergency Medicine | Admitting: Emergency Medicine

## 2020-03-08 DIAGNOSIS — Y929 Unspecified place or not applicable: Secondary | ICD-10-CM | POA: Diagnosis not present

## 2020-03-08 DIAGNOSIS — X58XXXA Exposure to other specified factors, initial encounter: Secondary | ICD-10-CM | POA: Diagnosis not present

## 2020-03-08 DIAGNOSIS — Y999 Unspecified external cause status: Secondary | ICD-10-CM | POA: Insufficient documentation

## 2020-03-08 DIAGNOSIS — S76802A Unspecified injury of other specified muscles, fascia and tendons at thigh level, left thigh, initial encounter: Secondary | ICD-10-CM | POA: Diagnosis present

## 2020-03-08 DIAGNOSIS — Y9301 Activity, walking, marching and hiking: Secondary | ICD-10-CM | POA: Diagnosis not present

## 2020-03-08 DIAGNOSIS — S76212A Strain of adductor muscle, fascia and tendon of left thigh, initial encounter: Secondary | ICD-10-CM

## 2020-03-08 NOTE — ED Triage Notes (Signed)
Patient reports worsening pain at left groin/left hip joint radiating to left thigh onset last week , denies injury/ambulatory , pain increases when sitting/changing positions . Denies fever or chills.

## 2020-03-09 ENCOUNTER — Emergency Department (HOSPITAL_COMMUNITY): Payer: 59

## 2020-03-09 MED ORDER — KETOROLAC TROMETHAMINE 15 MG/ML IJ SOLN
15.0000 mg | Freq: Once | INTRAMUSCULAR | Status: AC
  Administered 2020-03-09: 15 mg via INTRAMUSCULAR
  Filled 2020-03-09 (×2): qty 1

## 2020-03-09 MED ORDER — CYCLOBENZAPRINE HCL 5 MG PO TABS: 5.0000 mg | ORAL_TABLET | Freq: Two times a day (BID) | ORAL | 0 refills | Status: AC | PRN

## 2020-03-09 MED ORDER — NAPROXEN 500 MG PO TABS: 500.0000 mg | ORAL_TABLET | Freq: Two times a day (BID) | ORAL | 0 refills | Status: AC

## 2020-03-09 MED ORDER — OXYCODONE-ACETAMINOPHEN 5-325 MG PO TABS
1.0000 | ORAL_TABLET | Freq: Once | ORAL | Status: AC
  Administered 2020-03-09: 1 via ORAL
  Filled 2020-03-09: qty 1

## 2020-03-09 NOTE — ED Provider Notes (Signed)
[facility] EMERGENCY DEPARTMENT Provider Note   CSN: [id number] Arrival date & time: 03/08/20  2242     History Chief Complaint  Patient presents with  . Groin Pain    [name] is a 59 y.o. female.  HPI     This is a 59 year old female with no significant past medical history who presents with left groin pain.  Patient reports that approximately 3 weeks ago she began to walk daily in an effort to lose weight.  She has increased her walking time from to 1 to 2 hours.  She states over the last several days she has had increasing pain in the left groin.  Initially it woke her from sleep.  It is worse with internal and external rotation of the hip.  She has been taking ibuprofen with minimal relief.  She is not aware of any trauma.  She denies numbness or tingling in the leg.  She states that the pain became unbearable last night.  She rates her pain at 7 out of 10 while sitting in the bed.  However, she states that if she moves her leg, it becomes unbearable.  Denies any bowel or bladder difficulty.  Denies any urinary symptoms or fevers.  Past Medical History:  Diagnosis Date  . GERD (gastroesophageal reflux disease)     There are no problems to display for this patient.   Past Surgical History:  Procedure Laterality Date  . ABDOMINAL HYSTERECTOMY    . CHOLECYSTECTOMY       OB History   No obstetric history on file.     Family History  Problem Relation Age of Onset  . Hypertension Mother     Social History   Tobacco Use  . Smoking status: Never Smoker  Substance Use Topics  . Alcohol use: Yes  . Drug use: No    Home Medications Prior to Admission medications   Medication Sig Start Date End Date Taking? Authorizing Provider  ibuprofen (ADVIL) 200 MG tablet Take 200 mg by mouth every 6 (six) hours as needed for mild pain.   Yes [provider]  cyclobenzaprine (FLEXERIL) 5 MG tablet Take 1 tablet (5 mg total) by mouth  2 (two) times daily as needed for muscle spasms. 03/09/20   Brayleigh Rybacki, Mayer Masker, MD  metroNIDAZOLE (FLAGYL) 500 MG tablet Take 4 tablets (2,000 mg total) by mouth 3 (three) times daily. Patient not taking: Reported on 03/09/2020 01/24/13   Sherren Mocha, MD  naproxen (NAPROSYN) 500 MG tablet Take 1 tablet (500 mg total) by mouth 2 (two) times daily. 03/09/20   Ty Oshima, Mayer Masker, MD  phenazopyridine (PYRIDIUM) 200 MG tablet Take 1 tablet (200 mg total) by mouth 3 (three) times daily as needed for pain. Patient not taking: Reported on 03/09/2020 01/24/13   Sherren Mocha, MD    Allergies    Patient has no known allergies.  Review of Systems   Review of Systems  Constitutional: Negative for fever.  Respiratory: Negative for shortness of breath.   Cardiovascular: Negative for chest pain.  Gastrointestinal: Negative for abdominal pain, nausea and vomiting.  Musculoskeletal:       Left groin pain  Neurological: Negative for weakness and numbness.  All other systems reviewed and are negative.   Physical Exam Updated Vital Signs BP 134/74 (BP Location: Right Arm)   Pulse 83   Temp 99.4 F (37.4 C) (Oral)   Resp 20   Ht 1.727 m (5\' 8" )  Wt 90 kg   SpO2 100%   BMI 30.17 kg/m   Physical Exam Vitals and nursing note reviewed.  Constitutional:      Appearance: She is well-developed.     Comments: Tearful but nontoxic-appearing  HENT:     Head: Normocephalic and atraumatic.     Mouth/Throat:     Mouth: Mucous membranes are moist.  Eyes:     Pupils: Pupils are equal, round, and reactive to light.  Cardiovascular:     Rate and Rhythm: Normal rate and regular rhythm.     Heart sounds: Normal heart sounds.  Pulmonary:     Effort: Pulmonary effort is normal. No respiratory distress.     Breath sounds: No wheezing.  Abdominal:     General: Bowel sounds are normal.     Palpations: Abdomen is soft.     Tenderness: There is no abdominal tenderness.     Hernia: No hernia is present.    Musculoskeletal:     Cervical back: Neck supple.     Comments: No objective tenderness to palpation of the left groin, no bulging or mass noted, pain with internal and external rotation of the hip, no tenderness to palpation of the greater trochanter, difficult active range of motion secondary to pain, normal passive range of motion but with pain  Skin:    General: Skin is warm and dry.  Neurological:     Mental Status: She is alert and oriented to person, place, and time.  Psychiatric:        Mood and Affect: Mood normal.     ED Results / Procedures / Treatments   Labs (all labs ordered are listed, but only abnormal results are displayed) Labs Reviewed - No data to display  EKG None  Radiology DG Pelvis 1-2 Views  Result Date: 03/09/2020 CLINICAL DATA:  Left groin pain EXAM: PELVIS - 1-2 VIEW COMPARISON:  None. FINDINGS: There is no evidence of pelvic fracture or diastasis. 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 No pelvic bone lesions are seen. IMPRESSION: No acute osseous abnormality. Electronically Signed   By: Prudencio Pair M.D.   On: 03/09/2020 03:46    Procedures Procedures (including critical care time)  Medications Ordered in ED Medications  oxyCODONE-acetaminophen (PERCOCET/ROXICET) 5-325 MG per tablet 1 tablet (1 tablet Oral Given 03/09/20 0325)  ketorolac (TORADOL) 15 MG/ML injection 15 mg (15 mg Intramuscular Given 03/09/20 0447)    ED Course  I have reviewed the triage vital signs and the nursing notes.  Pertinent labs & imaging results that were available during my care of the patient were reviewed by me and considered in my medical decision making (see chart for details).    MDM Rules/Calculators/A&P                       Patient presents with left groin pain.  Onset after initiating an exercise routine although she does not recall any trauma.  She is tearful on exam but nontoxic.  Pain elicited  with internal and external rotation of the left leg.  There is no obvious overlying skin changes, hernia.  Suspect strain.  Other considerations include occult pelvic ring fracture.  X-rays negative.  Patient much improved after Percocet and Toradol.  She is ambulatory independently.  Recommend stretching exercises and naproxen with Flexeril as needed.  She was advised not to take Flexeril while driving.  After history, exam, and medical workup I feel  the patient has been appropriately medically screened and is safe for discharge home. Pertinent diagnoses were discussed with the patient. Patient was given return precautions.   Final Clinical Impression(s) / ED Diagnoses Final diagnoses:  Inguinal strain, left, initial encounter    Rx / DC Orders ED Discharge Orders         Ordered    cyclobenzaprine (FLEXERIL) 5 MG tablet  2 times daily PRN     03/09/20 0523    naproxen (NAPROSYN) 500 MG tablet  2 times daily     03/09/20 0523           Zyrah Wiswell, Mayer Masker, MD 03/09/20 0530

## 2020-03-09 NOTE — Discharge Instructions (Signed)
You were seen today with left groin pain.  This is likely muscle strain or inflammation.  Take naproxen twice daily and add a muscle relaxer.  Do not drive while taking muscle relaxants.  Make sure that you are stretching.  Follow-up with sports medicine if not improving.

## 2020-03-09 NOTE — ED Notes (Signed)
Pt was able to ambulate to the bathroom w/ "less pain," stand by assistance only.

## 2020-07-16 ENCOUNTER — Ambulatory Visit: Payer: 59

## 2023-06-03 ENCOUNTER — Encounter (HOSPITAL_COMMUNITY): Payer: Self-pay

## 2023-06-03 ENCOUNTER — Emergency Department (HOSPITAL_BASED_OUTPATIENT_CLINIC_OR_DEPARTMENT_OTHER): Payer: Managed Care, Other (non HMO)

## 2023-06-03 ENCOUNTER — Emergency Department (HOSPITAL_COMMUNITY)
Admission: EM | Admit: 2023-06-03 | Discharge: 2023-06-03 | Disposition: A | Discharge status: Home / Self Care | Payer: Managed Care, Other (non HMO) | Attending: Emergency Medicine | Admitting: Emergency Medicine

## 2023-06-03 ENCOUNTER — Other Ambulatory Visit: Payer: Self-pay

## 2023-06-03 DIAGNOSIS — M7989 Other specified soft tissue disorders: Secondary | ICD-10-CM | POA: Diagnosis present

## 2023-06-03 DIAGNOSIS — R52 Pain, unspecified: Secondary | ICD-10-CM

## 2023-06-03 DIAGNOSIS — N760 Acute vaginitis: Secondary | ICD-10-CM | POA: Diagnosis not present

## 2023-06-03 DIAGNOSIS — B9689 Other specified bacterial agents as the cause of diseases classified elsewhere: Secondary | ICD-10-CM | POA: Insufficient documentation

## 2023-06-03 LAB — CBC WITH DIFFERENTIAL/PLATELET
Abs Immature Granulocytes: 0.03 10*3/uL (ref 0.00–0.07)
Basophils Absolute: 0 10*3/uL (ref 0.0–0.1)
Basophils Relative: 0 %
Eosinophils Absolute: 0.1 10*3/uL (ref 0.0–0.5)
Eosinophils Relative: 2 %
HCT: 38.5 % (ref 36.0–46.0)
Hemoglobin: 12.4 g/dL (ref 12.0–15.0)
Immature Granulocytes: 0 %
Lymphocytes Relative: 28 %
Lymphs Abs: 2 10*3/uL (ref 0.7–4.0)
MCH: 29.9 pg (ref 26.0–34.0)
MCHC: 32.2 g/dL (ref 30.0–36.0)
MCV: 92.8 fL (ref 80.0–100.0)
Monocytes Absolute: 0.4 10*3/uL (ref 0.1–1.0)
Monocytes Relative: 6 %
Neutro Abs: 4.3 10*3/uL (ref 1.7–7.7)
Neutrophils Relative %: 64 %
Platelets: 220 10*3/uL (ref 150–400)
RBC: 4.15 MIL/uL (ref 3.87–5.11)
RDW: 13.7 % (ref 11.5–15.5)
WBC: 6.9 10*3/uL (ref 4.0–10.5)
nRBC: 0 % (ref 0.0–0.2)

## 2023-06-03 LAB — WET PREP, GENITAL
Sperm: NONE SEEN
Trich, Wet Prep: NONE SEEN
WBC, Wet Prep HPF POC: <10 (ref <10)
Yeast Wet Prep HPF POC: NONE SEEN

## 2023-06-03 LAB — URINALYSIS, W/ REFLEX TO CULTURE (INFECTION SUSPECTED)
Bacteria, UA: NONE SEEN
Bilirubin Urine: NEGATIVE
Glucose, UA: NEGATIVE mg/dL
Ketones, ur: NEGATIVE mg/dL
Leukocytes,Ua: NEGATIVE
Nitrite: NEGATIVE
Protein, ur: NEGATIVE mg/dL
Specific Gravity, Urine: 1.002 — ABNORMAL LOW (ref 1.005–1.030)
pH: 6 (ref 5.0–8.0)

## 2023-06-03 LAB — BASIC METABOLIC PANEL
Anion gap: 9 (ref 5–15)
BUN: 11 mg/dL (ref 6–20)
CO2: 27 mmol/L (ref 22–32)
Calcium: 9.1 mg/dL (ref 8.9–10.3)
Chloride: 103 mmol/L (ref 98–111)
Creatinine, Ser: 0.59 mg/dL (ref 0.44–1.00)
GFR, Estimated: >60 mL/min (ref >60)
Glucose, Bld: 103 mg/dL — ABNORMAL HIGH (ref 70–99)
Potassium: 3.9 mmol/L (ref 3.5–5.1)
Sodium: 139 mmol/L (ref 135–145)

## 2023-06-03 LAB — HIV ANTIBODY (ROUTINE TESTING W REFLEX): HIV Screen 4th Generation wRfx: NONREACTIVE

## 2023-06-03 MED ORDER — METRONIDAZOLE 500 MG PO TABS: 500.0000 mg | ORAL_TABLET | Freq: Once | ORAL | Status: DC

## 2023-06-03 MED ORDER — STERILE WATER FOR INJECTION IJ SOLN
INTRAMUSCULAR | Status: AC
  Administered 2023-06-03: 1.9 mL
  Filled 2023-06-03: qty 10

## 2023-06-03 MED ORDER — METRONIDAZOLE 500 MG PO TABS
500.0000 mg | ORAL_TABLET | Freq: Once | ORAL | Status: AC
  Administered 2023-06-03: 500 mg via ORAL
  Filled 2023-06-03: qty 1

## 2023-06-03 MED ORDER — CEFTRIAXONE SODIUM 1 G IJ SOLR
500.0000 mg | Freq: Once | INTRAMUSCULAR | Status: AC
  Administered 2023-06-03: 500 mg via INTRAMUSCULAR
  Filled 2023-06-03: qty 10

## 2023-06-03 MED ORDER — METRONIDAZOLE 500 MG PO TABS: 500.0000 mg | ORAL_TABLET | Freq: Two times a day (BID) | ORAL | 0 refills | Status: DC

## 2023-06-03 MED ORDER — AZITHROMYCIN 250 MG PO TABS
1000.0000 mg | ORAL_TABLET | Freq: Once | ORAL | Status: AC
  Administered 2023-06-03: 1000 mg via ORAL
  Filled 2023-06-03: qty 4

## 2023-06-03 NOTE — Progress Notes (Signed)
RLE venous duplex has been completed.  Preliminary results given to [facility], PA-C.   Results can be found under chart review under CV PROC. 06/03/2023 7:03 PM Jackelynn Hosie RVT, RDMS

## 2023-06-03 NOTE — ED Provider Notes (Signed)
[place] EMERGENCY DEPARTMENT AT [facility] Provider Note   CSN: [id number] Arrival date & time: 06/03/23  1478     History  Chief Complaint  Patient presents with   Leg Swelling    [name] is a 62 y.o. female who presents to ED complaining of right upper leg swelling since this morning. Feels like it could be due to dehydration or possible groin lymphadenopathy.  Patient also complains of intermittent RLQ pain when she drinks beer/soda/ETOH. No pain currently. Also endorses recent unprotected sex and is wondering if this is related to RLQ pain. Denies vaginal irritation, dyspareunia, dysuria, hematuria. Patient requesting STI testing.  Denies hx DVT/PE, trauma, hx cancer, tobacco use, estrogen use, recent surgeries/immobilization, chest pain, dyspnea, palpitations. Denies numbness/paresthesias.  HPI     Home Medications Prior to Admission medications   Medication Sig Start Date End Date Taking? Authorizing Provider  cyclobenzaprine (FLEXERIL) 5 MG tablet Take 1 tablet (5 mg total) by mouth 2 (two) times daily as needed for muscle spasms. 03/09/20   Horton, Mayer Masker, MD  ibuprofen (ADVIL) 200 MG tablet Take 200 mg by mouth every 6 (six) hours as needed for mild pain.    [provider]  metroNIDAZOLE (FLAGYL) 500 MG tablet Take 4 tablets (2,000 mg total) by mouth 3 (three) times daily. Patient not taking: Reported on 03/09/2020 01/24/13   Sherren Mocha, MD  naproxen (NAPROSYN) 500 MG tablet Take 1 tablet (500 mg total) by mouth 2 (two) times daily. 03/09/20   Horton, Mayer Masker, MD  phenazopyridine (PYRIDIUM) 200 MG tablet Take 1 tablet (200 mg total) by mouth 3 (three) times daily as needed for pain. Patient not taking: Reported on 03/09/2020 01/24/13   Sherren Mocha, MD      Allergies    Patient has no known allergies.    Review of Systems   Review of Systems  Musculoskeletal:        Right upper leg swelling    Physical Exam Updated Vital  Signs BP (!) 149/102   Pulse 72   Temp 98.3 F (36.8 C) (Oral)   Resp 17   Ht 5\' 8"  (1.727 m)   Wt 90 kg   SpO2 100%   BMI 30.17 kg/m  Physical Exam Vitals and nursing note reviewed.  Constitutional:      General: She is not in acute distress.    Appearance: She is not ill-appearing or toxic-appearing.  HENT:     Head: Normocephalic and atraumatic.     Mouth/Throat:     Mouth: Mucous membranes are moist.     Pharynx: No oropharyngeal exudate or posterior oropharyngeal erythema.  Eyes:     General: No scleral icterus.       Right eye: No discharge.        Left eye: No discharge.     Conjunctiva/sclera: Conjunctivae normal.  Cardiovascular:     Rate and Rhythm: Normal rate and regular rhythm.     Pulses: Normal pulses.     Heart sounds: Normal heart sounds. No murmur heard.    Comments: +2 pedal pulse Pulmonary:     Effort: Pulmonary effort is normal. No respiratory distress.     Breath sounds: Normal breath sounds. No wheezing, rhonchi or rales.  Abdominal:     General: Abdomen is flat.     Palpations: Abdomen is soft.     Tenderness: There is no abdominal tenderness.  Musculoskeletal:     Right lower leg: No  edema.     Left lower leg: No edema.     Comments: No swelling appreciated of right leg when compared to left. No calf tenderness.  Skin:    General: Skin is warm and dry.     Findings: No rash.     Comments: No lesions/rashes/erythema  Neurological:     General: No focal deficit present.     Mental Status: She is alert. Mental status is at baseline.     Comments: Sensation to light touch intact of bilateral lower legs.  Psychiatric:        Mood and Affect: Mood normal.     ED Results / Procedures / Treatments   Labs (all labs ordered are listed, but only abnormal results are displayed) Labs Reviewed  WET PREP, GENITAL - Abnormal; Notable for the following components:      Result Value   Clue Cells Wet Prep HPF POC PRESENT (*)    All other components  within normal limits  BASIC METABOLIC PANEL - Abnormal; Notable for the following components:   Glucose, Bld 103 (*)    All other components within normal limits  URINALYSIS, W/ REFLEX TO CULTURE (INFECTION SUSPECTED) - Abnormal; Notable for the following components:   Color, Urine COLORLESS (*)    Specific Gravity, Urine 1.002 (*)    Hgb urine dipstick SMALL (*)    All other components within normal limits  CBC WITH DIFFERENTIAL/PLATELET  HIV ANTIBODY (ROUTINE TESTING W REFLEX)  GC/CHLAMYDIA PROBE AMP ([place]) NOT AT [facility]    EKG None  Radiology VAS [place] LOWER EXTREMITY VENOUS (DVT) (7a-7p)  Result Date: 06/03/2023  Lower Venous DVT Study Patient Name:  [name]  Date of Exam:   06/03/2023 Medical Rec #: [id number]        Accession #:    [id number] Date of Birth: 04/09/1961        Patient Gender: F Patient Age:   38 years Exam Location:  [facility] Procedure:      VAS [place] LOWER EXTREMITY VENOUS (DVT) Referring Phys: [facility] Winnie Umali --------------------------------------------------------------------------------  Indications: Swelling & pain since this morning.  Comparison Study: No previous exams Performing Technologist: Jody Hill RVT, RDMS  Examination Guidelines: A complete evaluation includes B-mode imaging, spectral Doppler, color Doppler, and power Doppler as needed of all accessible portions of each vessel. Bilateral testing is considered an integral part of a complete examination. Limited examinations for reoccurring indications may be performed as noted. The reflux portion of the exam is performed with the patient in reverse Trendelenburg.  +---------+---------------+---------+-----------+----------+--------------+ RIGHT    CompressibilityPhasicitySpontaneityPropertiesThrombus Aging +---------+---------------+---------+-----------+----------+--------------+ CFV      Full           Yes      Yes                                  +---------+---------------+---------+-----------+----------+--------------+ SFJ      Full                                                        +---------+---------------+---------+-----------+----------+--------------+ FV Prox  Full           Yes      Yes                                 +---------+---------------+---------+-----------+----------+--------------+  FV Mid   Full           Yes      Yes                                 +---------+---------------+---------+-----------+----------+--------------+ FV DistalFull           Yes      Yes                                 +---------+---------------+---------+-----------+----------+--------------+ PFV      Full                                                        +---------+---------------+---------+-----------+----------+--------------+ POP      Full           Yes      Yes                                 +---------+---------------+---------+-----------+----------+--------------+ PTV      Full                                                        +---------+---------------+---------+-----------+----------+--------------+ PERO     Full                                                        +---------+---------------+---------+-----------+----------+--------------+   +----+---------------+---------+-----------+----------+--------------+ LEFTCompressibilityPhasicitySpontaneityPropertiesThrombus Aging +----+---------------+---------+-----------+----------+--------------+ CFV Full           Yes      Yes                                 +----+---------------+---------+-----------+----------+--------------+     Summary: RIGHT: - There is no evidence of deep vein thrombosis in the lower extremity.  - No cystic structure found in the popliteal fossa.  LEFT: - No evidence of common femoral vein obstruction.  *See table(s) above for measurements and observations.    Preliminary     Procedures Procedures     Medications Ordered in ED Medications - No data to display  ED Course/ Medical Decision Making/ A&P                             Medical Decision Making Amount and/or Complexity of Data Reviewed Labs: ordered.   This patient presents to the ED for concern of right leg swelling, this involves an extensive number of treatment options, and is a complaint that carries with it a high risk of complications and morbidity.  The differential diagnosis includes cellulitis, abscess, DVT, lymphedema, venous insufficiency   Co morbidities that complicate the patient evaluation  none     Lab Tests:  I Ordered, and personally interpreted labs.  The pertinent results include:   -wet prep: positive for clue cells -gc/chlamydia: pending -UA: not concerning for infection -BMP: no concern for electrolyte abnormality; no concern for kidney damage -HIV: pending -CBC: No concern for anemia or leukocytosis    Imaging Studies ordered:  I ordered imaging studies including  -VAS [place] lower extremity: to assess for DVT in right leg I independently visualized and interpreted imaging I agree with the radiologist interpretation    Problem List / ED Course / Critical interventions / Medication management  Patient presents to the ED complaining of right groin swelling and intermittent mild RLQ pain.  Upon talking with patient she endorses having intermittent vaginal discharge and recent unprotected sex. Ultrasound without concern for DVT.  Blood work reassuring.  Urinalysis without concern for infection.  Wet prep concerning for bacterial vaginosis.  GC/chlamydia swab pending. Provided patient with a dose of Flagyl for BV here in ED and will discharge her with rest of course of Flagyl. Also provided patient with dose of ceftriaxone, and azithromycin here in the ED for GC/Chlamydia prophylaxis.  Educated patient to follow-up with [facility] if her GC/chlamydia swabs  come back positive.  Patient afebrile and denies severe abdominal pain, dyspareunia - less concerning for PID/ovarian abscess at this time. [place] without concern for DVT. I believe her subjective leg swelling is due to lymphadenopathy from BV. Also educated patient to stop drinking ETOH/soda if it is causing her RLQ pain. I have reviewed the patients home medicines and have made adjustments as needed Patient afebrile with stable vitals.  Patient states she is ready for discharge.  Provided patient with strict return precautions.   Social Determinants of Health:  none           Final Clinical Impression(s) / ED Diagnoses Final diagnoses:  Bacterial vaginosis    Rx / DC Orders ED Discharge Orders     None         Margarita Rana 06/03/23 2013    Derwood Kaplan, MD 06/03/23 2151

## 2023-06-03 NOTE — ED Notes (Signed)
RN rounding, on pt, pt denies any needs at this time , RR even and unlabored, pt updated on current plan of care. Call be in reach, pt informed to call for any needs or questions

## 2023-06-03 NOTE — Discharge Instructions (Addendum)
It was a pleasure caring for you today. Workup was concerning for Bacterial Vaginosis. I have sent in a prescription for Flagyl for you to take for the next 7 days. Your gonohhrea and chlamydia test are pending and will take a couple of days to result. We are giving you a prophylactic dose of antibiotics for GC/Chlamydia. If these tests come back positive, you will need to report to the [facility] for treatment.  Seek emergency care if experiencing any new or worsening symptoms

## 2023-06-03 NOTE — ED Triage Notes (Signed)
Right lower leg swelling that started this morning. Right lower abd pain x few months states hurts more when drinking alcohol

## 2023-06-04 LAB — GC/CHLAMYDIA PROBE AMP (~~LOC~~) NOT AT ARMC
Chlamydia: NEGATIVE
Comment: NEGATIVE
Comment: NORMAL
Neisseria Gonorrhea: NEGATIVE

## 2023-06-18 ENCOUNTER — Encounter (HOSPITAL_COMMUNITY): Payer: Self-pay | Admitting: Emergency Medicine

## 2023-06-18 ENCOUNTER — Emergency Department (HOSPITAL_COMMUNITY)
Admission: EM | Admit: 2023-06-18 | Discharge: 2023-06-19 | Disposition: A | Discharge status: Home / Self Care | Payer: Managed Care, Other (non HMO) | Attending: Emergency Medicine | Admitting: Emergency Medicine

## 2023-06-18 ENCOUNTER — Emergency Department (HOSPITAL_COMMUNITY): Payer: Managed Care, Other (non HMO)

## 2023-06-18 ENCOUNTER — Other Ambulatory Visit: Payer: Self-pay

## 2023-06-18 DIAGNOSIS — J168 Pneumonia due to other specified infectious organisms: Secondary | ICD-10-CM | POA: Insufficient documentation

## 2023-06-18 DIAGNOSIS — R0602 Shortness of breath: Secondary | ICD-10-CM | POA: Diagnosis present

## 2023-06-18 DIAGNOSIS — R000 Tachycardia, unspecified: Secondary | ICD-10-CM | POA: Diagnosis not present

## 2023-06-18 DIAGNOSIS — J189 Pneumonia, unspecified organism: Secondary | ICD-10-CM

## 2023-06-18 LAB — BASIC METABOLIC PANEL
Anion gap: 11 (ref 5–15)
BUN: 11 mg/dL (ref 6–20)
CO2: 28 mmol/L (ref 22–32)
Calcium: 8.8 mg/dL — ABNORMAL LOW (ref 8.9–10.3)
Chloride: 96 mmol/L — ABNORMAL LOW (ref 98–111)
Creatinine, Ser: 0.83 mg/dL (ref 0.44–1.00)
GFR, Estimated: >60 mL/min (ref >60)
Glucose, Bld: 114 mg/dL — ABNORMAL HIGH (ref 70–99)
Potassium: 3.3 mmol/L — ABNORMAL LOW (ref 3.5–5.1)
Sodium: 135 mmol/L (ref 135–145)

## 2023-06-18 LAB — CBC
HCT: 35.4 % — ABNORMAL LOW (ref 36.0–46.0)
Hemoglobin: 11.4 g/dL — ABNORMAL LOW (ref 12.0–15.0)
MCH: 29.6 pg (ref 26.0–34.0)
MCHC: 32.2 g/dL (ref 30.0–36.0)
MCV: 91.9 fL (ref 80.0–100.0)
Platelets: 260 10*3/uL (ref 150–400)
RBC: 3.85 MIL/uL — ABNORMAL LOW (ref 3.87–5.11)
RDW: 13.3 % (ref 11.5–15.5)
WBC: 11.6 10*3/uL — ABNORMAL HIGH (ref 4.0–10.5)
nRBC: 0 % (ref 0.0–0.2)

## 2023-06-18 LAB — TROPONIN I (HIGH SENSITIVITY): Troponin I (High Sensitivity): 5 ng/L (ref <18)

## 2023-06-18 LAB — LACTIC ACID, PLASMA: Lactic Acid, Venous: 1.1 mmol/L (ref 0.5–1.9)

## 2023-06-18 MED ORDER — AZITHROMYCIN 250 MG PO TABS
500.0000 mg | ORAL_TABLET | Freq: Once | ORAL | Status: AC
  Administered 2023-06-18: 500 mg via ORAL
  Filled 2023-06-18: qty 2

## 2023-06-18 MED ORDER — AMOXICILLIN 500 MG PO CAPS: 1000.0000 mg | ORAL_CAPSULE | Freq: Three times a day (TID) | ORAL | 0 refills | Status: AC

## 2023-06-18 MED ORDER — LACTATED RINGERS IV BOLUS
1000.0000 mL | Freq: Once | INTRAVENOUS | Status: AC
  Administered 2023-06-18: 1000 mL via INTRAVENOUS

## 2023-06-18 MED ORDER — ACETAMINOPHEN 325 MG PO TABS
650.0000 mg | ORAL_TABLET | Freq: Once | ORAL | Status: AC
  Administered 2023-06-18: 650 mg via ORAL
  Filled 2023-06-18: qty 2

## 2023-06-18 MED ORDER — SODIUM CHLORIDE 0.9 % IV SOLN
1.0000 g | Freq: Once | INTRAVENOUS | Status: AC
  Administered 2023-06-18: 1 g via INTRAVENOUS
  Filled 2023-06-18: qty 10

## 2023-06-18 MED ORDER — AZITHROMYCIN 250 MG PO TABS: 250.0000 mg | ORAL_TABLET | Freq: Every day | ORAL | 0 refills | Status: AC

## 2023-06-18 MED ORDER — ALBUTEROL SULFATE HFA 108 (90 BASE) MCG/ACT IN AERS: 2.0000 | INHALATION_SPRAY | RESPIRATORY_TRACT | Status: DC | PRN

## 2023-06-18 NOTE — ED Provider Notes (Signed)
[place] EMERGENCY DEPARTMENT AT [facility] Provider Note   CSN: [id number] Arrival date & time: 06/18/23  1800     History  Chief Complaint  Patient presents with   Shortness of Breath    [name] is a 62 y.o. female.   Shortness of Breath    Patient presents ED for evaluation of shortness of breath.  Patient states symptoms initially started after an episode of having some chest discomfort nausea and vomiting that she attributed to something she ate.  Patient states she has continued to have pain on the right side of her chest since then.  She has been feeling short of breath.  Pain increases every time she coughs or takes deep breath.  She denies any leg swelling.  No known history of heart disease.  No history of PE or DVT  Home Medications Prior to Admission medications   Medication Sig Start Date End Date Taking? Authorizing Provider  amoxicillin (AMOXIL) 500 MG capsule Take 2 capsules (1,000 mg total) by mouth 3 (three) times daily for 10 days. 06/18/23 06/28/23 Yes Linwood Dibbles, MD  azithromycin (ZITHROMAX) 250 MG tablet Take 1 tablet (250 mg total) by mouth daily. Take first 2 tablets together, then 1 every day until finished. 06/18/23  Yes Linwood Dibbles, MD  cyclobenzaprine (FLEXERIL) 5 MG tablet Take 1 tablet (5 mg total) by mouth 2 (two) times daily as needed for muscle spasms. 03/09/20   Horton, Mayer Masker, MD  ibuprofen (ADVIL) 200 MG tablet Take 200 mg by mouth every 6 (six) hours as needed for mild pain.    [provider]  naproxen (NAPROSYN) 500 MG tablet Take 1 tablet (500 mg total) by mouth 2 (two) times daily. 03/09/20   Horton, Mayer Masker, MD  phenazopyridine (PYRIDIUM) 200 MG tablet Take 1 tablet (200 mg total) by mouth 3 (three) times daily as needed for pain. Patient not taking: Reported on 03/09/2020 01/24/13   Sherren Mocha, MD      Allergies    Patient has no known allergies.    Review of Systems   Review of Systems  Respiratory:   Positive for shortness of breath.     Physical Exam Updated Vital Signs BP 113/70   Pulse 92   Temp 98.8 F (37.1 C) (Oral)   Resp 18   Ht 1.727 m (5\' 8" )   Wt 90 kg   SpO2 99%   BMI 30.17 kg/m  Physical Exam Vitals and nursing note reviewed.  Constitutional:      General: She is not in acute distress.    Appearance: She is well-developed.  HENT:     Head: Normocephalic and atraumatic.     Right Ear: External ear normal.     Left Ear: External ear normal.  Eyes:     General: No scleral icterus.       Right eye: No discharge.        Left eye: No discharge.     Conjunctiva/sclera: Conjunctivae normal.  Neck:     Trachea: No tracheal deviation.  Cardiovascular:     Rate and Rhythm: Normal rate and regular rhythm.  Pulmonary:     Effort: Pulmonary effort is normal. No respiratory distress.     Breath sounds: Normal breath sounds. No stridor. No wheezing or rales.  Chest:     Chest wall: Tenderness present.     Comments: Palpation right anterior chest wall, no deformity noted Abdominal:     General: Bowel  sounds are normal. There is no distension.     Palpations: Abdomen is soft.     Tenderness: There is no abdominal tenderness. There is no guarding or rebound.  Musculoskeletal:        General: No tenderness or deformity.     Cervical back: Neck supple.  Skin:    General: Skin is warm and dry.     Findings: No rash.  Neurological:     General: No focal deficit present.     Mental Status: She is alert.     Cranial Nerves: No cranial nerve deficit, dysarthria or facial asymmetry.     Sensory: No sensory deficit.     Motor: No abnormal muscle tone or seizure activity.     Coordination: Coordination normal.  Psychiatric:        Mood and Affect: Mood normal.     ED Results / Procedures / Treatments   Labs (all labs ordered are listed, but only abnormal results are displayed) Labs Reviewed  CBC - Abnormal; Notable for the following components:      Result Value    WBC 11.6 (*)    RBC 3.85 (*)    Hemoglobin 11.4 (*)    HCT 35.4 (*)    All other components within normal limits  BASIC METABOLIC PANEL - Abnormal; Notable for the following components:   Potassium 3.3 (*)    Chloride 96 (*)    Glucose, Bld 114 (*)    Calcium 8.8 (*)    All other components within normal limits  CULTURE, BLOOD (ROUTINE X 2)  CULTURE, BLOOD (ROUTINE X 2)  LACTIC ACID, PLASMA  TROPONIN I (HIGH SENSITIVITY)    EKG EKG Interpretation Date/Time:  Tuesday June 18 2023 18:27:30 EDT Ventricular Rate:  106 PR Interval:  116 QRS Duration:  79 QT Interval:  315 QTC Calculation: 419 R Axis:   34  Text Interpretation: Sinus tachycardia No old tracing to compare Confirmed by Linwood Dibbles [phone]) on 06/18/2023 8:53:04 PM  Radiology DG Chest 2 View  Result Date: 06/18/2023 CLINICAL DATA:  This of breath for several days, initial encounter EXAM: CHEST - 2 VIEW COMPARISON:  04/03/2004 FINDINGS: Cardiac shadow is within normal limits. Lungs are well aerated bilaterally. Right basilar effusion and infiltrate is seen. No bony abnormality is noted. IMPRESSION: Right lower lobe infiltrate with associated effusion. Electronically Signed   By: Alcide Clever M.D.   On: 06/18/2023 19:31    Procedures Procedures    Medications Ordered in ED Medications  albuterol (VENTOLIN HFA) 108 (90 Base) MCG/ACT inhaler 2 puff (has no administration in time range)  lactated ringers bolus 1,000 mL (0 mLs Intravenous Stopped 06/18/23 2228)  cefTRIAXone (ROCEPHIN) 1 g in sodium chloride 0.9 % 100 mL IVPB (0 g Intravenous Stopped 06/18/23 2154)  azithromycin (ZITHROMAX) tablet 500 mg (500 mg Oral Given 06/18/23 2118)  acetaminophen (TYLENOL) tablet 650 mg (650 mg Oral Given 06/18/23 2136)    ED Course/ Medical Decision Making/ A&P Clinical Course as of 06/18/23 2314  Tue Jun 18, 2023  2052 Chest x-ray shows a right lower lobe pneumonia with effusion [JK]  2120 Patient has temperature of 102 [JK]  2245  Lactic acid level not elevated. [JK]  2245 Troponin normal. [JK]  2304 Patient is feeling better.  She would like to go home.  Fever has decreased [JK]    Clinical Course User Index [JK] Linwood Dibbles, MD  Medical Decision Making Problems Addressed: Pneumonia of right lower lobe due to infectious organism: acute illness or injury that poses a threat to life or bodily functions  Amount and/or Complexity of Data Reviewed Labs: ordered. Decision-making details documented in ED Course. Radiology: ordered and independent interpretation performed.  Risk OTC drugs. Prescription drug management.   Patient presented to the ED for evaluation of chest pain.  Patient noted increased pain with deep breathing and coughing.  Patient ended up spiking a fever here in the emergency room.  Her laboratory tests did not show any severe electrolyte disturbance.  Her lactic acid is not elevated arguing against systemic infection.  Patient troponin is normal arguing against cardiac injury.  Chest x-ray does show pneumonia.  I suspect her chest pain and fever is related to her right lower lobe pneumonia.  Patient is feeling better.  He does not have an oxygen requirement.  She was given a dose of IV antibiotics.  She would like to go home.  Will plan on charge home with amoxicillin and azithromycin.        Final Clinical Impression(s) / ED Diagnoses Final diagnoses:  Pneumonia of right lower lobe due to infectious organism    Rx / DC Orders ED Discharge Orders          Ordered    amoxicillin (AMOXIL) 500 MG capsule  3 times daily        06/18/23 2311    azithromycin (ZITHROMAX) 250 MG tablet  Daily        06/18/23 2311              Linwood Dibbles, MD 06/18/23 2315

## 2023-06-18 NOTE — Discharge Instructions (Addendum)
Take the 2 antibiotics to treat your pneumonia.  You were given a dose of antibiotics here in the emergency room so you can start taking them tomorrow.  Take Tylenol for your fever.  Follow-up with your doctor to be rechecked to make sure the infection has cleared.  Return to the ER for high fevers worsening symptoms.

## 2023-06-18 NOTE — ED Triage Notes (Signed)
Patient report SOB x4 days. Patient report worsening SOB tonight. Patient report chest pain everytime she cough. Patient denies N/V. Patient a/ox4.

## 2023-06-19 LAB — CULTURE, BLOOD (ROUTINE X 2): Culture: NO GROWTH

## 2023-06-20 LAB — CULTURE, BLOOD (ROUTINE X 2)

## 2023-06-21 LAB — CULTURE, BLOOD (ROUTINE X 2)
Culture: NO GROWTH
Special Requests: ADEQUATE

## 2023-06-23 LAB — CULTURE, BLOOD (ROUTINE X 2)
